# Patient Record
Sex: Female | Born: 1955 | Race: White | Hispanic: No | Marital: Married | Smoking: Never smoker
Health system: Southern US, Community
[De-identification: ages and names within clinical notes are randomized; demographics above are authoritative.]

## PROBLEM LIST (undated history)

## (undated) DIAGNOSIS — M1990 Unspecified osteoarthritis, unspecified site: Secondary | ICD-10-CM

## (undated) DIAGNOSIS — J45909 Unspecified asthma, uncomplicated: Secondary | ICD-10-CM

## (undated) HISTORY — PX: ABDOMINAL HYSTERECTOMY: SHX81

## (undated) HISTORY — PX: ROTATOR CUFF REPAIR: SHX139

## (undated) HISTORY — PX: BREAST SURGERY: SHX581

---

## 2013-06-02 ENCOUNTER — Emergency Department (HOSPITAL_BASED_OUTPATIENT_CLINIC_OR_DEPARTMENT_OTHER): Payer: BC Managed Care – PPO

## 2013-06-02 ENCOUNTER — Encounter (HOSPITAL_BASED_OUTPATIENT_CLINIC_OR_DEPARTMENT_OTHER): Payer: Self-pay | Admitting: Emergency Medicine

## 2013-06-02 ENCOUNTER — Emergency Department (HOSPITAL_BASED_OUTPATIENT_CLINIC_OR_DEPARTMENT_OTHER)
Admission: EM | Admit: 2013-06-02 | Discharge: 2013-06-02 | Disposition: A | Discharge status: Home / Self Care | Payer: BC Managed Care – PPO | Attending: Emergency Medicine | Admitting: Emergency Medicine

## 2013-06-02 DIAGNOSIS — Z79899 Other long term (current) drug therapy: Secondary | ICD-10-CM | POA: Insufficient documentation

## 2013-06-02 DIAGNOSIS — J45901 Unspecified asthma with (acute) exacerbation: Secondary | ICD-10-CM | POA: Insufficient documentation

## 2013-06-02 DIAGNOSIS — Z880 Allergy status to penicillin: Secondary | ICD-10-CM | POA: Insufficient documentation

## 2013-06-02 DIAGNOSIS — Z8739 Personal history of other diseases of the musculoskeletal system and connective tissue: Secondary | ICD-10-CM | POA: Insufficient documentation

## 2013-06-02 DIAGNOSIS — R000 Tachycardia, unspecified: Secondary | ICD-10-CM | POA: Insufficient documentation

## 2013-06-02 DIAGNOSIS — J069 Acute upper respiratory infection, unspecified: Secondary | ICD-10-CM

## 2013-06-02 DIAGNOSIS — J45909 Unspecified asthma, uncomplicated: Secondary | ICD-10-CM

## 2013-06-02 HISTORY — DX: Unspecified asthma, uncomplicated: J45.909

## 2013-06-02 HISTORY — DX: Unspecified osteoarthritis, unspecified site: M19.90

## 2013-06-02 MED ORDER — ALBUTEROL SULFATE HFA 108 (90 BASE) MCG/ACT IN AERS: 2.0000 | INHALATION_SPRAY | RESPIRATORY_TRACT | Status: AC | PRN

## 2013-06-02 MED ORDER — BENZONATATE 100 MG PO CAPS: 100.0000 mg | ORAL_CAPSULE | Freq: Three times a day (TID) | ORAL | Status: AC

## 2013-06-02 MED ORDER — IPRATROPIUM-ALBUTEROL 0.5-2.5 (3) MG/3ML IN SOLN
3.0000 mL | Freq: Once | RESPIRATORY_TRACT | Status: AC
  Administered 2013-06-02: 3 mL via RESPIRATORY_TRACT
  Filled 2013-06-02: qty 3

## 2013-06-02 MED ORDER — ONDANSETRON 4 MG PO TBDP
4.0000 mg | ORAL_TABLET | Freq: Once | ORAL | Status: AC
  Administered 2013-06-02: 4 mg via ORAL
  Filled 2013-06-02: qty 1

## 2013-06-02 MED ORDER — ALBUTEROL SULFATE (2.5 MG/3ML) 0.083% IN NEBU
2.5000 mg | INHALATION_SOLUTION | Freq: Once | RESPIRATORY_TRACT | Status: AC
  Administered 2013-06-02: 2.5 mg via RESPIRATORY_TRACT
  Filled 2013-06-02: qty 3

## 2013-06-02 MED ORDER — DOXYCYCLINE HYCLATE 100 MG PO TABS
100.0000 mg | ORAL_TABLET | Freq: Once | ORAL | Status: AC
  Administered 2013-06-02: 100 mg via ORAL
  Filled 2013-06-02: qty 1

## 2013-06-02 NOTE — ED Notes (Signed)
Fever, nasal and chest congestion. Headache. Hx of asthma.

## 2013-06-02 NOTE — Discharge Instructions (Signed)
Asthma, Adult Asthma is a recurring condition in which the airways tighten and narrow. Asthma can make it difficult to breathe. It can cause coughing, wheezing, and shortness of breath. Asthma episodes (also called asthma attacks) range from minor to life-threatening. Asthma cannot be cured, but medicines and lifestyle changes can help control it. CAUSES Asthma is believed to be caused by inherited (genetic) and environmental factors, but its exact cause is unknown. Asthma may be triggered by allergens, lung infections, or irritants in the air. Asthma triggers are different for each person. Common triggers include:   Animal dander.  Dust mites.  Cockroaches.  Pollen from trees or grass.  Mold.  Smoke.  Air pollutants such as dust, household cleaners, hair sprays, aerosol sprays, paint fumes, strong chemicals, or strong odors.  Cold air, weather changes, and winds (which increase molds and pollens in the air).  Strong emotional expressions such as crying or laughing hard.  Stress.  Certain medicines (such as aspirin) or types of drugs (such as beta-blockers).  Sulfites in foods and drinks. Foods and drinks that may contain sulfites include dried fruit, potato chips, and sparkling grape juice.  Infections or inflammatory conditions such as the flu, a cold, or an inflammation of the nasal membranes (rhinitis).  Gastroesophageal reflux disease (GERD).  Exercise or strenuous activity. SYMPTOMS Symptoms may occur immediately after asthma is triggered or many hours later. Symptoms include:  Wheezing.  Excessive nighttime or early morning coughing.  Frequent or severe coughing with a common cold.  Chest tightness.  Shortness of breath. DIAGNOSIS  The diagnosis of asthma is made by a review of your medical history and a physical exam. Tests may also be performed. These may include:  Lung function studies. These tests show how much air you breath in and out.  Allergy  tests.  Imaging tests such as X-rays. TREATMENT  Asthma cannot be cured, but it can usually be controlled. Treatment involves identifying and avoiding your asthma triggers. It also involves medicines. There are 2 classes of medicine used for asthma treatment:   Controller medicines. These prevent asthma symptoms from occurring. They are usually taken every day.  Reliever or rescue medicines. These quickly relieve asthma symptoms. They are used as needed and provide short-term relief. Your health care provider will help you create an asthma action plan. An asthma action plan is a written plan for managing and treating your asthma attacks. It includes a list of your asthma triggers and how they may be avoided. It also includes information on when medicines should be taken and when their dosage should be changed. An action plan may also involve the use of a device called a peak flow meter. A peak flow meter measures how well the lungs are working. It helps you monitor your condition. HOME CARE INSTRUCTIONS   Take medicine as directed by your health care provider. Speak with your health care provider if you have questions about how or when to take the medicines.  Use a peak flow meter as directed by your health care provider. Record and keep track of readings.  Understand and use the action plan to help minimize or stop an asthma attack without needing to seek medical care.  Control your home environment in the following ways to help prevent asthma attacks:  Do not smoke. Avoid being exposed to secondhand smoke.  Change your heating and air conditioning filter regularly.  Limit your use of fireplaces and wood stoves.  Get rid of pests (such as roaches and   mice) and their droppings.  Throw away plants if you see mold on them.  Clean your floors and dust regularly. Use unscented cleaning products.  Try to have someone else vacuum for you regularly. Stay out of rooms while they are being  vacuumed and for a short while afterward. If you vacuum, use a dust mask from a hardware store, a double-layered or microfilter vacuum cleaner bag, or a vacuum cleaner with a HEPA filter.  Replace carpet with wood, tile, or vinyl flooring. Carpet can trap dander and dust.  Use allergy-proof pillows, mattress covers, and box spring covers.  Wash bed sheets and blankets every week in hot water and dry them in a dryer.  Use blankets that are made of polyester or cotton.  Clean bathrooms and kitchens with bleach. If possible, have someone repaint the walls in these rooms with mold-resistant paint. Keep out of the rooms that are being cleaned and painted.  Wash hands frequently. SEEK MEDICAL CARE IF:   You have wheezing, shortness of breath, or a cough even if taking medicine to prevent attacks.  The colored mucus you cough up (sputum) is thicker than usual.  Your sputum changes from clear or white to yellow, green, gray, or bloody.  You have any problems that may be related to the medicines you are taking (such as a rash, itching, swelling, or trouble breathing).  You are using a reliever medicine more than 2 3 times per week.  Your peak flow is still at 50 79% of you personal best after following your action plan for 1 hour. SEEK IMMEDIATE MEDICAL CARE IF:   You seem to be getting worse and are unresponsive to treatment during an asthma attack.  You are short of breath even at rest.  You get short of breath when doing very little physical activity.  You have difficulty eating, drinking, or talking due to asthma symptoms.  You develop chest pain.  You develop a fast heartbeat.  You have a bluish color to your lips or fingernails.  You are lightheaded, dizzy, or faint.  Your peak flow is less than 50% of your personal best.  You have a fever or persistent symptoms for more than 2 3 days.  You have a fever and symptoms suddenly get worse. MAKE SURE YOU:   Understand these  instructions.  Will watch your condition.  Will get help right away if you are not doing well or get worse. Document Released: 03/23/2005 Document Revised: 11/23/2012 Document Reviewed: 10/20/2012 [employer] Patient Information 2014 [employer]. Upper Respiratory Infection, Adult An upper respiratory infection (URI) is also sometimes known as the common cold. The upper respiratory tract includes the nose, sinuses, throat, trachea, and bronchi. Bronchi are the airways leading to the lungs. Most people improve within 1 week, but symptoms can last up to 2 weeks. A residual cough may last even longer.  CAUSES Many different viruses can infect the tissues lining the upper respiratory tract. The tissues become irritated and inflamed and often become very moist. Mucus production is also common. A cold is contagious. You can easily spread the virus to others by oral contact. This includes kissing, sharing a glass, coughing, or sneezing. Touching your mouth or nose and then touching a surface, which is then touched by another person, can also spread the virus. SYMPTOMS  Symptoms typically develop 1 to 3 days after you come in contact with a cold virus. Symptoms vary from person to person. They may include:  Runny nose.    Sneezing.  Nasal congestion.  Sinus irritation.  Sore throat.  Loss of voice (laryngitis).  Cough.  Fatigue.  Muscle aches.  Loss of appetite.  Headache.  Low-grade fever. DIAGNOSIS  You might diagnose your own cold based on familiar symptoms, since most people get a cold 2 to 3 times a year. Your caregiver can confirm this based on your exam. Most importantly, your caregiver can check that your symptoms are not due to another disease such as strep throat, sinusitis, pneumonia, asthma, or epiglottitis. Blood tests, throat tests, and X-rays are not necessary to diagnose a common cold, but they may sometimes be helpful in excluding other more serious diseases. Your  caregiver will decide if any further tests are required. RISKS AND COMPLICATIONS  You may be at risk for a more severe case of the common cold if you smoke cigarettes, have chronic heart disease (such as heart failure) or lung disease (such as asthma), or if you have a weakened immune system. The very young and very old are also at risk for more serious infections. Bacterial sinusitis, middle ear infections, and bacterial pneumonia can complicate the common cold. The common cold can worsen asthma and chronic obstructive pulmonary disease (COPD). Sometimes, these complications can require emergency medical care and may be life-threatening. PREVENTION  The best way to protect against getting a cold is to practice good hygiene. Avoid oral or hand contact with people with cold symptoms. Wash your hands often if contact occurs. There is no clear evidence that vitamin C, vitamin E, echinacea, or exercise reduces the chance of developing a cold. However, it is always recommended to get plenty of rest and practice good nutrition. TREATMENT  Treatment is directed at relieving symptoms. There is no cure. Antibiotics are not effective, because the infection is caused by a virus, not by bacteria. Treatment may include:  Increased fluid intake. Sports drinks offer valuable electrolytes, sugars, and fluids.  Breathing heated mist or steam (vaporizer or shower).  Eating chicken soup or other clear broths, and maintaining good nutrition.  Getting plenty of rest.  Using gargles or lozenges for comfort.  Controlling fevers with ibuprofen or acetaminophen as directed by your caregiver.  Increasing usage of your inhaler if you have asthma. Zinc gel and zinc lozenges, taken in the first 24 hours of the common cold, can shorten the duration and lessen the severity of symptoms. Pain medicines may help with fever, muscle aches, and throat pain. A variety of non-prescription medicines are available to treat congestion  and runny nose. Your caregiver can make recommendations and may suggest nasal or lung inhalers for other symptoms.  HOME CARE INSTRUCTIONS   Only take over-the-counter or prescription medicines for pain, discomfort, or fever as directed by your caregiver.  Use a warm mist humidifier or inhale steam from a shower to increase air moisture. This may keep secretions moist and make it easier to breathe.  Drink enough water and fluids to keep your urine clear or pale yellow.  Rest as needed.  Return to work when your temperature has returned to normal or as your caregiver advises. You may need to stay home longer to avoid infecting others. You can also use a face mask and careful hand washing to prevent spread of the virus. SEEK MEDICAL CARE IF:   After the first few days, you feel you are getting worse rather than better.  You need your caregiver's advice about medicines to control symptoms.  You develop chills, worsening shortness of breath, or   brown or red sputum. These may be signs of pneumonia.  You develop yellow or brown nasal discharge or pain in the face, especially when you bend forward. These may be signs of sinusitis.  You develop a fever, swollen neck glands, pain with swallowing, or white areas in the back of your throat. These may be signs of strep throat. SEEK IMMEDIATE MEDICAL CARE IF:   You have a fever.  You develop severe or persistent headache, ear pain, sinus pain, or chest pain.  You develop wheezing, a prolonged cough, cough up blood, or have a change in your usual mucus (if you have chronic lung disease).  You develop sore muscles or a stiff neck. Document Released: 09/16/2000 Document Revised: 06/15/2011 Document Reviewed: 07/25/2010 [employer] Patient Information 2014 [employer].  

## 2013-06-02 NOTE — ED Provider Notes (Signed)
CSN: [id number]     Arrival date & time 06/02/13  2014 History   First MD Initiated Contact with Patient 06/02/13 2058     Chief Complaint  Patient presents with  . Shortness of Breath     (Consider location/radiation/quality/duration/timing/severity/associated sxs/prior Treatment) Patient is a 58 y.o. female presenting with shortness of breath. The history is provided by the patient.  Shortness of Breath Severity:  Moderate Associated symptoms: cough and wheezing   Associated symptoms: no abdominal pain, no chest pain, no headaches, no rash and no vomiting    patient with shortness of breath and cough. Some mild production. She has been at a hospital with her husband who has had pneumonia. She's also states that in other family members have pneumonia. She has a history of flares up with allergens or infection. She began to feel bad a couple days ago. She's had fevers up to 102. She's had some mild chest pain. No swelling or legs. No relief with her inhaler at home  Past Medical History  Diagnosis Date  . Asthma   . Arthritis    Past Surgical History  Procedure Laterality Date  . Abdominal hysterectomy    . Breast surgery    . Rotator cuff repair     No family history on file. History  Substance Use Topics  . Smoking status: Never Smoker   . Smokeless tobacco: Not on file  . Alcohol Use: Yes   OB History   Grav Para Term Preterm Abortions TAB SAB Ect Mult Living                 Review of Systems  Constitutional: Negative for activity change and appetite change.  Eyes: Negative for pain.  Respiratory: Positive for cough, shortness of breath and wheezing. Negative for chest tightness.   Cardiovascular: Negative for chest pain and leg swelling.  Gastrointestinal: Negative for nausea, vomiting, abdominal pain and diarrhea.  Genitourinary: Negative for flank pain.  Musculoskeletal: Negative for back pain and neck stiffness.  Skin: Negative for rash.  Neurological: Negative  for weakness, numbness and headaches.  Psychiatric/Behavioral: Negative for behavioral problems.      Allergies  Cefzil; Erythromycin; Penicillins; Percocet; Vicodin; and Wellbutrin  Home Medications   Current Outpatient Rx  Name  Route  Sig  Dispense  Refill  . ALBUTEROL IN   Inhalation   Inhale into the lungs.         . ALPRAZolam (XANAX PO)   Oral   Take by mouth.         Marland Kitchen. albuterol (PROVENTIL HFA;VENTOLIN HFA) 108 (90 BASE) MCG/ACT inhaler   Inhalation   Inhale 2 puffs into the lungs every 4 (four) hours as needed for wheezing or shortness of breath.   1 Inhaler   0   . benzonatate (TESSALON) 100 MG capsule   Oral   Take 1 capsule (100 mg total) by mouth every 8 (eight) hours.   21 capsule   0    BP 140/70  Pulse 118  Temp(Src) 99.3 F (37.4 C) (Oral)  Resp 20  Ht 5\' 2"  (1.575 m)  Wt 195 lb (88.451 kg)  BMI 35.66 kg/m2  SpO2 96% Physical Exam  Nursing note and vitals reviewed. Constitutional: She is oriented to person, place, and time. She appears well-developed and well-nourished.  HENT:  Head: Normocephalic and atraumatic.  Eyes: EOM are normal. Pupils are equal, round, and reactive to light.  Neck: Normal range of motion. Neck supple.  Cardiovascular: Regular  rhythm and normal heart sounds.   No murmur heard. Tachycardia  Pulmonary/Chest: No respiratory distress. She has wheezes. She has no rales.  Mild tachypnea. Diffuse wheezes and prolonged expirations.  Abdominal: Soft. Bowel sounds are normal. She exhibits no distension. There is no tenderness. There is no rebound and no guarding.  Musculoskeletal: Normal range of motion.  Neurological: She is alert and oriented to person, place, and time. No cranial nerve deficit.  Skin: Skin is warm and dry.  Psychiatric: She has a normal mood and affect. Her speech is normal.    ED Course  Procedures (including critical care time) Labs Review Labs Reviewed - No data to display Imaging Review Dg  Chest 2 View  06/02/2013   CLINICAL DATA:  Fever, congestion, headache.  EXAM: CHEST  2 VIEW  COMPARISON:  No comparisons  FINDINGS: Trachea is deviated slightly to the left, which can be seen with right thyroid enlargement. Heart size normal. Lungs are clear. No pleural fluid.  IMPRESSION: No acute findings.   Electronically Signed   By: Leanna Battles M.D.   On: 06/02/2013 21:20     EKG Interpretation None      MDM   Final diagnoses:  URI (upper respiratory infection)  Asthma    Patient with chest pain. Negative x-ray. Has had contacts with pneumonia. We'll treat with antibiotics.    Juliet Rude. Rubin Payor, MD 06/04/13 [phone]

## 2013-06-03 NOTE — ED Provider Notes (Signed)
Patient has called in because she was given doxycycline in the ER, but was not given a prescription for antibiotic. She is feeling her albuterol inhaler, wants to know if she should have antibiotic. Records were reviewed, she does not have pneumonia, but was given a doxycycline in the ER. Will call in a 10 day course of doxycycline in addition to the albuterol.  Gilda Creasehristopher J. Tabius Rood, MD 06/03/13 [phone]

## 2014-10-17 IMAGING — CR DG CHEST 2V
2 series · 2 of 2 positions shown · non-contrast
Comparison: No comparisons

CLINICAL DATA: Fever, congestion, headache.

EXAM:
CHEST  2 VIEW

[w chest pa]
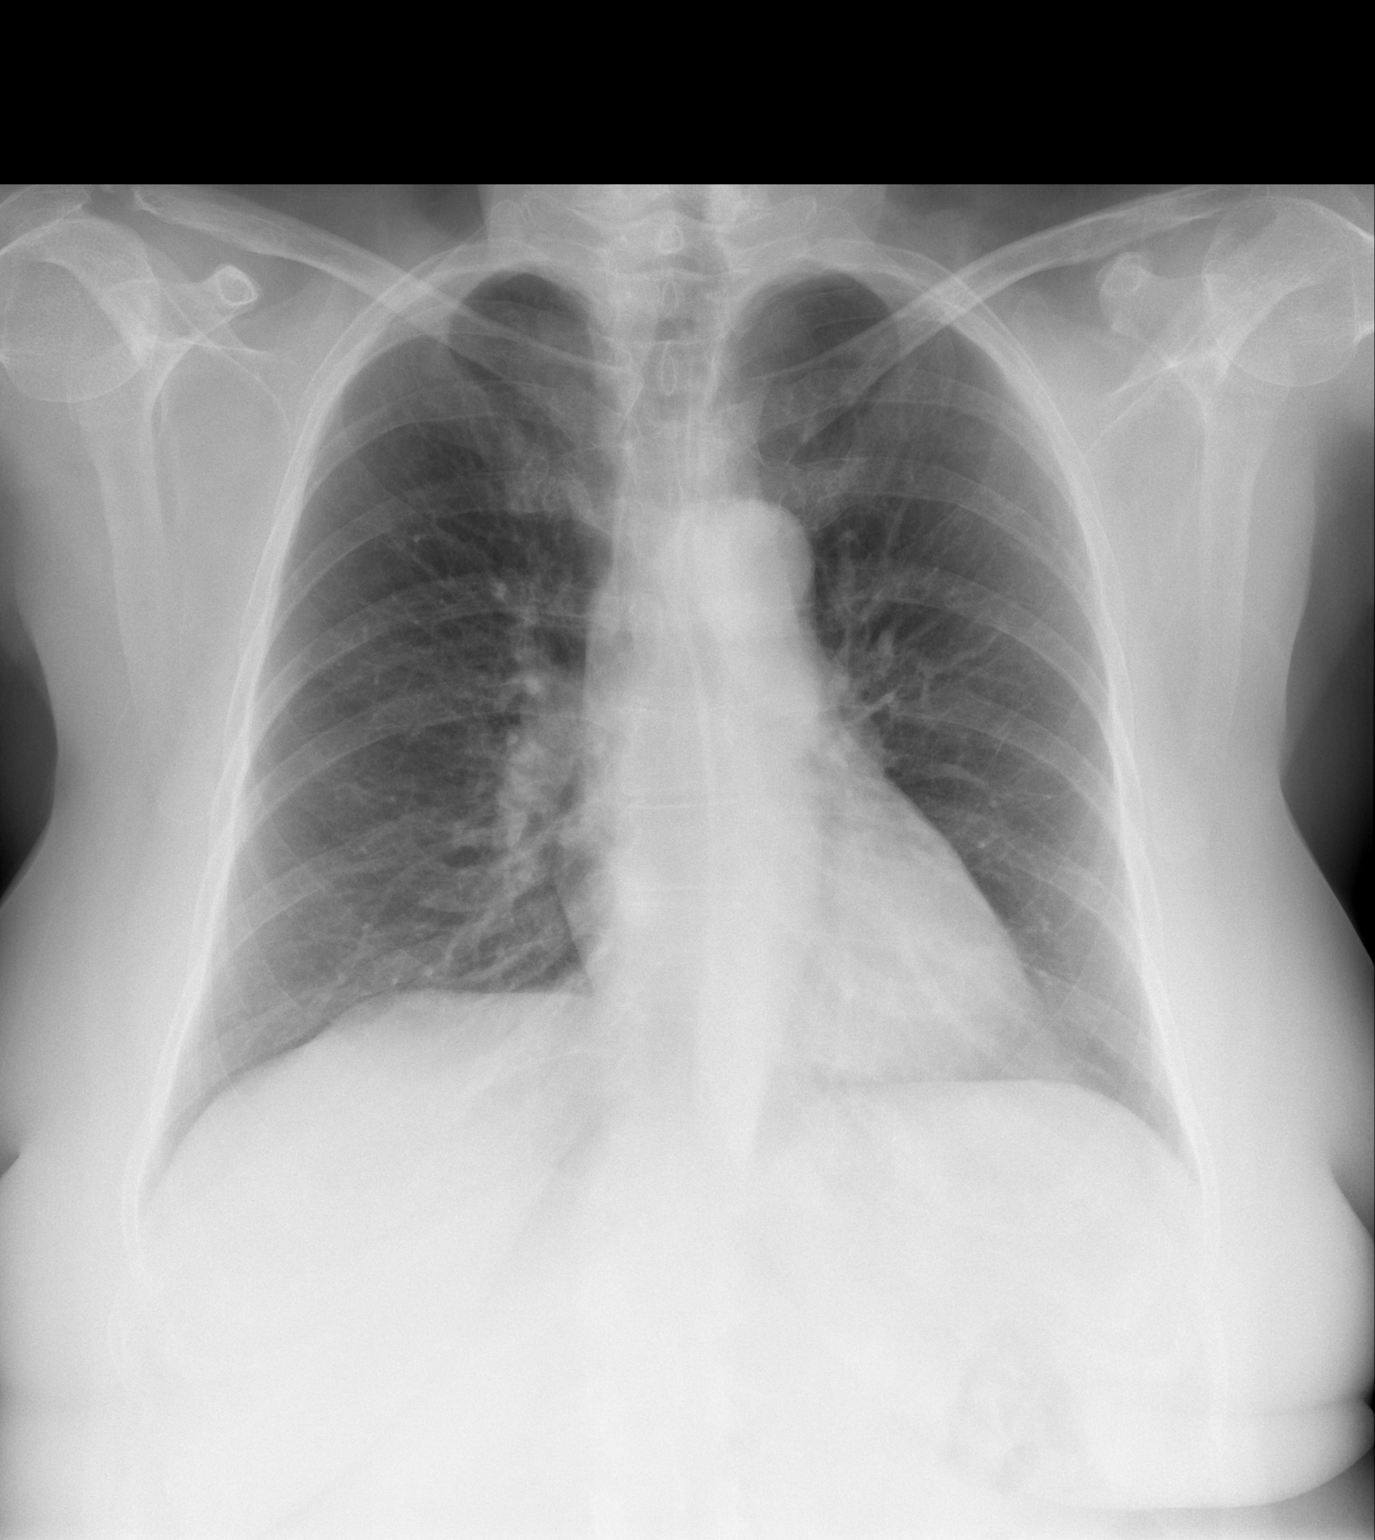

[w chest lat]
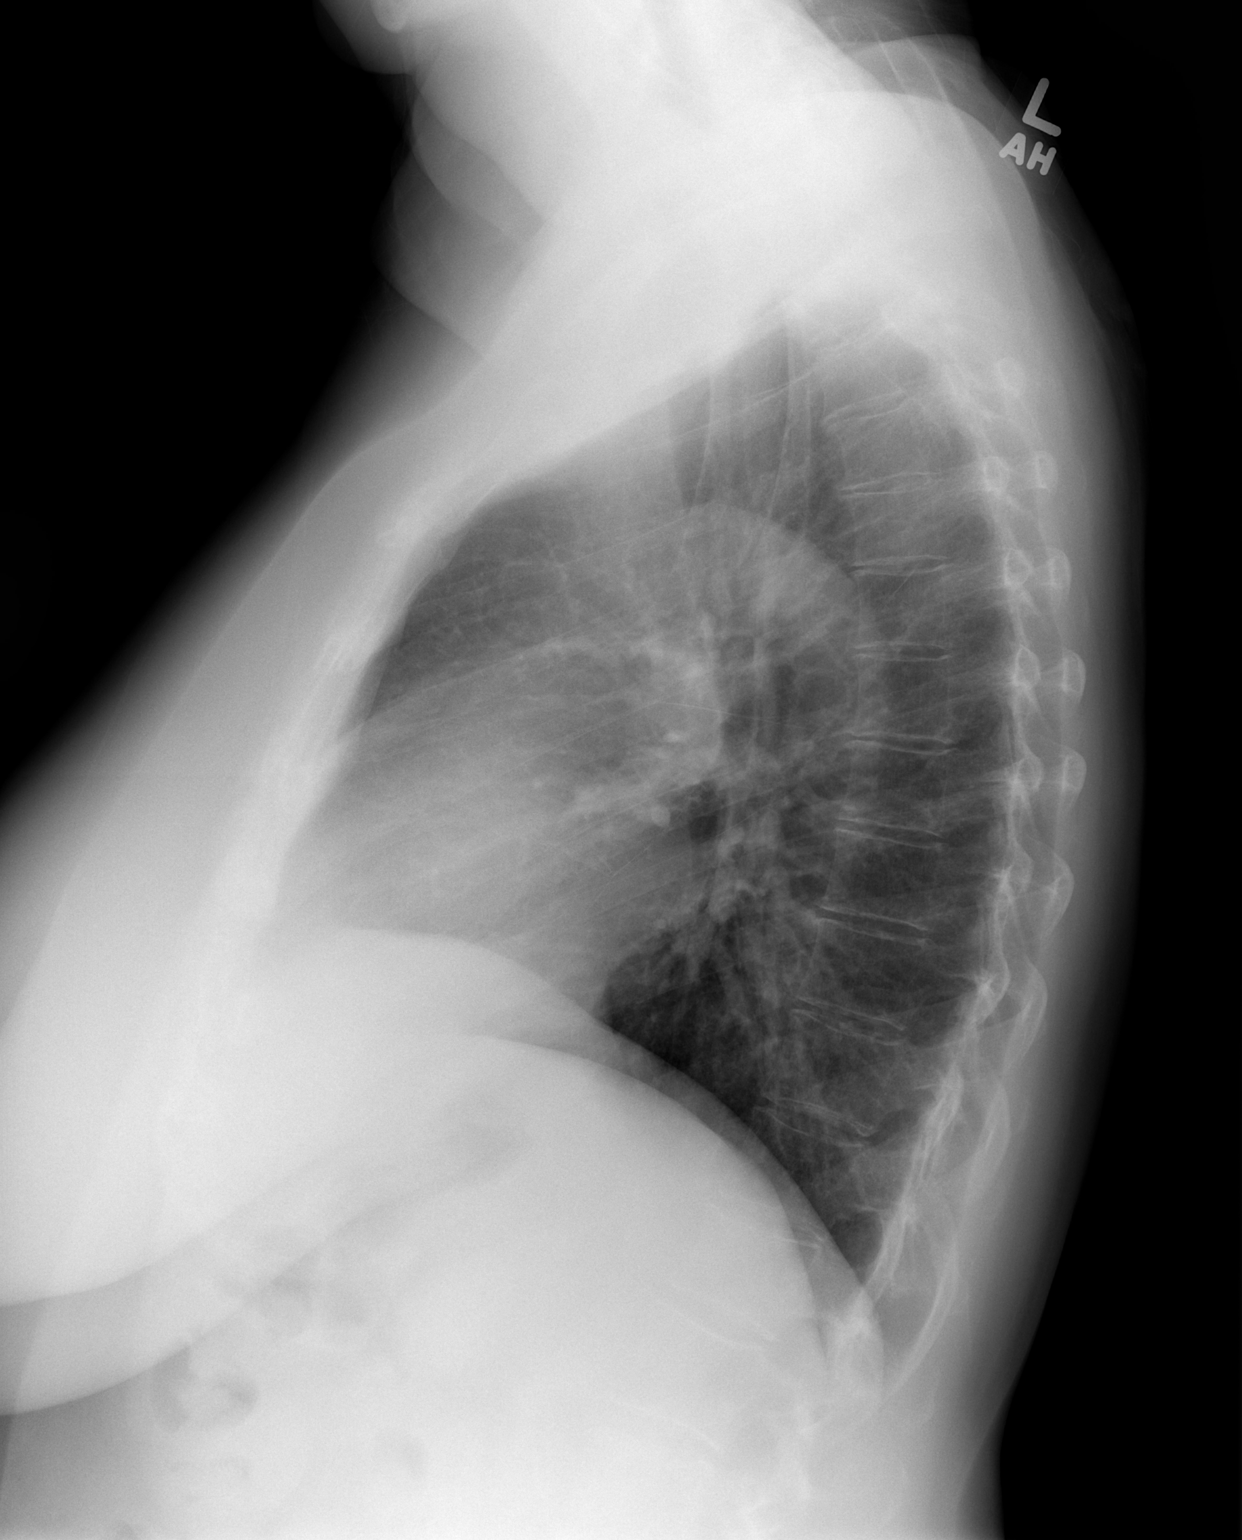

[2 of 2 positions shown; findings below may reference images not displayed]

FINDINGS: Trachea is deviated slightly to the left, which can be seen with
right thyroid enlargement. Heart size normal. Lungs are clear. No
pleural fluid.
IMPRESSION: No acute findings.
